# Patient Record
Sex: Male | Born: 1975 | Race: White | Hispanic: No | Marital: Married | Smoking: Never smoker
Health system: Southern US, Community
[De-identification: ages and names within clinical notes are randomized; demographics above are authoritative.]

---

## 2014-09-08 ENCOUNTER — Emergency Department (HOSPITAL_COMMUNITY)
Admission: EM | Admit: 2014-09-08 | Discharge: 2014-09-08 | Disposition: A | Discharge status: Home / Self Care | Payer: BC Managed Care – PPO | Attending: Emergency Medicine | Admitting: Emergency Medicine

## 2014-09-08 ENCOUNTER — Emergency Department (HOSPITAL_COMMUNITY): Payer: BC Managed Care – PPO

## 2014-09-08 ENCOUNTER — Encounter (HOSPITAL_COMMUNITY): Payer: Self-pay | Admitting: Family Medicine

## 2014-09-08 DIAGNOSIS — R0602 Shortness of breath: Secondary | ICD-10-CM | POA: Insufficient documentation

## 2014-09-08 DIAGNOSIS — J159 Unspecified bacterial pneumonia: Secondary | ICD-10-CM | POA: Diagnosis not present

## 2014-09-08 DIAGNOSIS — R002 Palpitations: Secondary | ICD-10-CM | POA: Diagnosis not present

## 2014-09-08 DIAGNOSIS — J189 Pneumonia, unspecified organism: Secondary | ICD-10-CM

## 2014-09-08 LAB — I-STAT CHEM 8, ED
BUN: 20 mg/dL (ref 6–23)
CALCIUM ION: 1.16 mmol/L (ref 1.12–1.23)
Chloride: 103 mEq/L (ref 96–112)
Creatinine, Ser: 1.2 mg/dL (ref 0.50–1.35)
Glucose, Bld: 87 mg/dL (ref 70–99)
HCT: 44 % (ref 39.0–52.0)
HEMOGLOBIN: 15 g/dL (ref 13.0–17.0)
Potassium: 3.9 mEq/L (ref 3.7–5.3)
SODIUM: 139 meq/L (ref 137–147)
TCO2: 24 mmol/L (ref 0–100)

## 2014-09-08 LAB — I-STAT TROPONIN, ED: Troponin i, poc: 0 ng/mL (ref 0.00–0.08)

## 2014-09-08 LAB — MAGNESIUM: MAGNESIUM: 2.2 mg/dL (ref 1.5–2.5)

## 2014-09-08 MED ORDER — DOXYCYCLINE HYCLATE 100 MG PO TABS
100.0000 mg | ORAL_TABLET | Freq: Once | ORAL | Status: AC
  Administered 2014-09-08: 100 mg via ORAL
  Filled 2014-09-08: qty 1

## 2014-09-08 MED ORDER — DOXYCYCLINE HYCLATE 100 MG PO CAPS: 100.0000 mg | ORAL_CAPSULE | Freq: Two times a day (BID) | ORAL | Status: AC

## 2014-09-08 NOTE — ED Notes (Signed)
Per pt sts driving in car today and started having some palpitations and SOB. sts similar episode yesterday and then it went away and he felt better. sts he went for a run yesterday.

## 2014-09-08 NOTE — ED Provider Notes (Signed)
CSN: [id number]     Arrival date & time 09/08/14  [id number] History   First MD Initiated Contact with Patient 09/08/14 [phone]     Chief Complaint  Patient presents with  . Palpitations     (Consider location/radiation/quality/duration/timing/severity/associated sxs/prior Treatment) HPI [name] is a 38 year old male with no past medical history who presents the ER complaining of intermittent palpitations for the past 1-2 weeks. Patient states his palpitations have occurred several times a day, and he has not noticed any aggravating or alleviating factors. Patient states the episodes last "several minutes", and subside spontaneously. Patient reports associated lightheadedness, mild shortness of breath with these episodes. Patient states these associated symptoms subside when the palpitations subside. Patient denies any associated chest pain, fever, syncope, nausea, vomiting, abdominal pain, dysuria.  History reviewed. No pertinent past medical history. History reviewed. No pertinent past surgical history. History reviewed. No pertinent family history. History  Substance Use Topics  . Smoking status: Never Smoker   . Smokeless tobacco: Not on file  . Alcohol Use: Yes    Review of Systems  Constitutional: Negative for fever.  HENT: Negative for trouble swallowing.   Eyes: Negative for visual disturbance.  Respiratory: Positive for shortness of breath.   Cardiovascular: Positive for palpitations. Negative for chest pain.  Gastrointestinal: Negative for nausea, vomiting and abdominal pain.  Genitourinary: Negative for dysuria.  Musculoskeletal: Negative for neck pain.  Skin: Negative for rash.  Neurological: Negative for dizziness, weakness and numbness.  Psychiatric/Behavioral: Negative.       Allergies  Review of patient's allergies indicates no known allergies.  Home Medications   Prior to Admission medications   Medication Sig Start Date End Date Taking? Authorizing Provider   doxycycline (VIBRAMYCIN) 100 MG capsule Take 1 capsule (100 mg total) by mouth 2 (two) times daily. One po bid x 7 days 09/08/14   Monte FantasiaJoseph W Brahim Dolman, PA-C   BP 126/75 mmHg  Pulse 70  Temp(Src) 98.9 F (37.2 C) (Oral)  Resp 12  Ht 6\' 1"  (1.854 m)  Wt 175 lb (79.379 kg)  BMI 23.09 kg/m2  SpO2 98% Physical Exam  Constitutional: He is oriented to person, place, and time. He appears well-developed and well-nourished. No distress.  HENT:  Head: Normocephalic and atraumatic.  Mouth/Throat: Oropharynx is clear and moist. No oropharyngeal exudate.  Eyes: Right eye exhibits no discharge. Left eye exhibits no discharge. No scleral icterus.  Neck: Normal range of motion.  Cardiovascular: Normal rate, regular rhythm, S1 normal, S2 normal and normal heart sounds.   No murmur heard. Pulmonary/Chest: Effort normal and breath sounds normal. No accessory muscle usage. No tachypnea. No respiratory distress.  Abdominal: Soft. Normal appearance and bowel sounds are normal. There is no tenderness.  Musculoskeletal: Normal range of motion. He exhibits no edema or tenderness.  Neurological: He is alert and oriented to person, place, and time. He has normal strength. No cranial nerve deficit. Coordination normal. GCS eye subscore is 4. GCS verbal subscore is 5. GCS motor subscore is 6.  Skin: Skin is warm and dry. No rash noted. He is not diaphoretic.  Psychiatric: He has a normal mood and affect.  Nursing note and vitals reviewed.   ED Course  Procedures (including critical care time) Labs Review Labs Reviewed  MAGNESIUM  I-STAT CHEM 8, ED  Rosezena SensorI-STAT TROPOININ, ED    Imaging Review Dg Chest 2 View  09/08/2014   CLINICAL DATA:  Cardiac palpitations with difficulty breathing  EXAM: CHEST  2 VIEW  COMPARISON:  None.  FINDINGS: There is a small area of infiltrate in the right upper lobe. Elsewhere lungs are clear. Heart size pulmonary vascularity are normal. No adenopathy. There is upper thoracic  levoscoliosis.  IMPRESSION: Small area of infiltrate in right upper lobe.   Electronically Signed   By: Bretta BangWilliam  Woodruff M.D.   On: 09/08/2014 10:23     EKG Interpretation   Date/Time:  Monday September 08 2014 08:59:17 EST Ventricular Rate:  81 PR Interval:  160 QRS Duration: 88 QT Interval:  380 QTC Calculation: 441 R Axis:   92 Text Interpretation:  Normal sinus rhythm Biatrial enlargement Rightward  axis Early repolarization No old tracing to compare Confirmed by [facility] CenterMCCMANUS   MD, Nicholos JohnsKATHLEEN [phone]([id number]) on 09/08/2014 9:27:31 AM      MDM   Final diagnoses:  Palpitations  Community acquired pneumonia    Intermittent palpitations with lightheadedness 1 week. Patient reporting symptoms decreased with exercise yesterday, and he "felt much better after going for a 5 mile run". -- Based on this information, pt's s/s are not exertional and non-concerning for anginal etiology. Patient is he has not taken his heart rate. PERC negative.  EKG sinus rhythm with no acute injury or ectopy.    Patient remaining asymptomatic while in the ER. Patient nontachypneic, non-hypoxic, non-tachycardic. Patient's radiograph remarkable for small area of infiltrate in upper right lobe consistent with possible pneumonia. Patient on reexam states he has been having a productive cough for several days. Patient states he is under a large amount of stress with his job, and states he is also felt anxiousness with his palpitations. With negative cardiac w/u-- negative troponin, HEART score 0, we will discharge pt at this time. Pt sent home on abx for CAP.  Pt instructed how to assess HR if he should experience palpitations and educated on what a normal level is vs an abnormal HR.  I strongly recommended pt f/u with his PCP regarding these s/s today.  I discussed return precautions with pt and encouraged him to call or return to the ER should he have any questions or concerns.    BP 126/75 mmHg  Pulse 70  Temp(Src) 98.9 F  (37.2 C) (Oral)  Resp 12  Ht 6\' 1"  (1.854 m)  Wt 175 lb (79.379 kg)  BMI 23.09 kg/m2  SpO2 98%  Signed,  Ladona MowJoe Marine Lezotte, PA-C 4:59 PM  Pt discussed with Dr. Samuel JesterKathleen McManus, MD.    Monte FantasiaJoseph W Rosanne Wohlfarth, PA-C 09/08/14 1659  Samuel JesterKathleen McManus, DO 09/11/14 1441

## 2014-09-08 NOTE — ED Notes (Signed)
Patient states he has been experiencing intermittent fluttering in the heart and SOB x 1 week.  Patient states these feelings have been experienced each day since the symptoms started.

## 2014-09-08 NOTE — Discharge Instructions (Signed)
Use extreme caution with exercise or strenuous activity before consulting your primary care doctor. Follow-up with your primary care doctor regarding the pneumonia and the palpitations. Return to the ER if you develop any severe shortness of breath, high fever greater than 100.5, chest pain or if you feel like you're going to pass out.  Pneumonia Pneumonia is an infection of the lungs.  CAUSES Pneumonia may be caused by bacteria or a virus. Usually, these infections are caused by breathing infectious particles into the lungs (respiratory tract). SIGNS AND SYMPTOMS   Cough.  Fever.  Chest pain.  Increased rate of breathing.  Wheezing.  Mucus production. DIAGNOSIS  If you have the common symptoms of pneumonia, your health care provider will typically confirm the diagnosis with a chest X-ray. The X-ray will show an abnormality in the lung (pulmonary infiltrate) if you have pneumonia. Other tests of your blood, urine, or sputum may be done to find the specific cause of your pneumonia. Your health care provider may also do tests (blood gases or pulse oximetry) to see how well your lungs are working. TREATMENT  Some forms of pneumonia may be spread to other people when you cough or sneeze. You may be asked to wear a mask before and during your exam. Pneumonia that is caused by bacteria is treated with antibiotic medicine. Pneumonia that is caused by the influenza virus may be treated with an antiviral medicine. Most other viral infections must run their course. These infections will not respond to antibiotics.  HOME CARE INSTRUCTIONS   Cough suppressants may be used if you are losing too much rest. However, coughing protects you by clearing your lungs. You should avoid using cough suppressants if you can.  Your health care provider may have prescribed medicine if he or she thinks your pneumonia is caused by bacteria or influenza. Finish your medicine even if you start to feel better.  Your  health care provider may also prescribe an expectorant. This loosens the mucus to be coughed up.  Take medicines only as directed by your health care provider.  Do not smoke. Smoking is a common cause of bronchitis and can contribute to pneumonia. If you are a smoker and continue to smoke, your cough may last several weeks after your pneumonia has cleared.  A cold steam vaporizer or humidifier in your room or home may help loosen mucus.  Coughing is often worse at night. Sleeping in a semi-upright position in a recliner or using a couple pillows under your head will help with this.  Get rest as you feel it is needed. Your body will usually let you know when you need to rest. PREVENTION A pneumococcal shot (vaccine) is available to prevent a common bacterial cause of pneumonia. This is usually suggested for:  People over 39 years old.  Patients on chemotherapy.  People with chronic lung problems, such as bronchitis or emphysema.  People with immune system problems. If you are over 65 or have a high risk condition, you may receive the pneumococcal vaccine if you have not received it before. In some countries, a routine influenza vaccine is also recommended. This vaccine can help prevent some cases of pneumonia.You may be offered the influenza vaccine as part of your care. If you smoke, it is time to quit. You may receive instructions on how to stop smoking. Your health care provider can provide medicines and counseling to help you quit. SEEK MEDICAL CARE IF: You have a fever. SEEK IMMEDIATE MEDICAL CARE IF:  Your illness becomes worse. This is especially true if you are elderly or weakened from any other disease.  You cannot control your cough with suppressants and are losing sleep.  You begin coughing up blood.  You develop pain which is getting worse or is uncontrolled with medicines.  Any of the symptoms which initially brought you in for treatment are getting worse rather than  better.  You develop shortness of breath or chest pain. MAKE SURE YOU:   Understand these instructions.  Will watch your condition.  Will get help right away if you are not doing well or get worse. Document Released: 10/03/2005 Document Revised: 02/17/2014 Document Reviewed: 12/23/2010 [facility] Patient Information 2015 [place]. This information is not intended to replace advice given to you by your health care provider. Make sure you discuss any questions you have with your health care provider.  Palpitations A palpitation is the feeling that your heartbeat is irregular or is faster than normal. It may feel like your heart is fluttering or skipping a beat. Palpitations are usually not a serious problem. However, in some cases, you may need further medical evaluation. CAUSES  Palpitations can be caused by:  Smoking.  Caffeine or other stimulants, such as diet pills or energy drinks.  Alcohol.  Stress and anxiety.  Strenuous physical activity.  Fatigue.  Certain medicines.  Heart disease, especially if you have a history of irregular heart rhythms (arrhythmias), such as atrial fibrillation, atrial flutter, or supraventricular tachycardia.  An improperly working pacemaker or defibrillator. DIAGNOSIS  To find the cause of your palpitations, your health care provider will take your medical history and perform a physical exam. Your health care provider may also have you take a test called an ambulatory electrocardiogram (ECG). An ECG records your heartbeat patterns over a 24-hour period. You may also have other tests, such as:  Transthoracic echocardiogram (TTE). During echocardiography, sound waves are used to evaluate how blood flows through your heart.  Transesophageal echocardiogram (TEE).  Cardiac monitoring. This allows your health care provider to monitor your heart rate and rhythm in real time.  Holter monitor. This is a portable device that records your  heartbeat and can help diagnose heart arrhythmias. It allows your health care provider to track your heart activity for several days, if needed.  Stress tests by exercise or by giving medicine that makes the heart beat faster. TREATMENT  Treatment of palpitations depends on the cause of your symptoms and can vary greatly. Most cases of palpitations do not require any treatment other than time, relaxation, and monitoring your symptoms. Other causes, such as atrial fibrillation, atrial flutter, or supraventricular tachycardia, usually require further treatment. HOME CARE INSTRUCTIONS   Avoid:  Caffeinated coffee, tea, soft drinks, diet pills, and energy drinks.  Chocolate.  Alcohol.  Stop smoking if you smoke.  Reduce your stress and anxiety. Things that can help you relax include:  A method of controlling things in your body, such as your heartbeats, with your mind (biofeedback).  Yoga.  Meditation.  Physical activity such as swimming, jogging, or walking.  Get plenty of rest and sleep. SEEK MEDICAL CARE IF:   You continue to have a fast or irregular heartbeat beyond 24 hours.  Your palpitations occur more often. SEEK IMMEDIATE MEDICAL CARE IF:  You have chest pain or shortness of breath.  You have a severe headache.  You feel dizzy or you faint. MAKE SURE YOU:  Understand these instructions.  Will watch your condition.  Will get  help right away if you are not doing well or get worse. Document Released: 09/30/2000 Document Revised: 10/08/2013 Document Reviewed: 12/02/2011 [facility] Patient Information 2015 [place], [place]. This information is not intended to replace advice given to you by your health care provider. Make sure you discuss any questions you have with your health care provider.

## 2016-04-01 IMAGING — DX DG CHEST 2V
2 series · 2 of 2 positions shown · non-contrast
Comparison: None.

CLINICAL DATA: Cardiac palpitations with difficulty breathing

EXAM:
CHEST  2 VIEW

[chest pa]
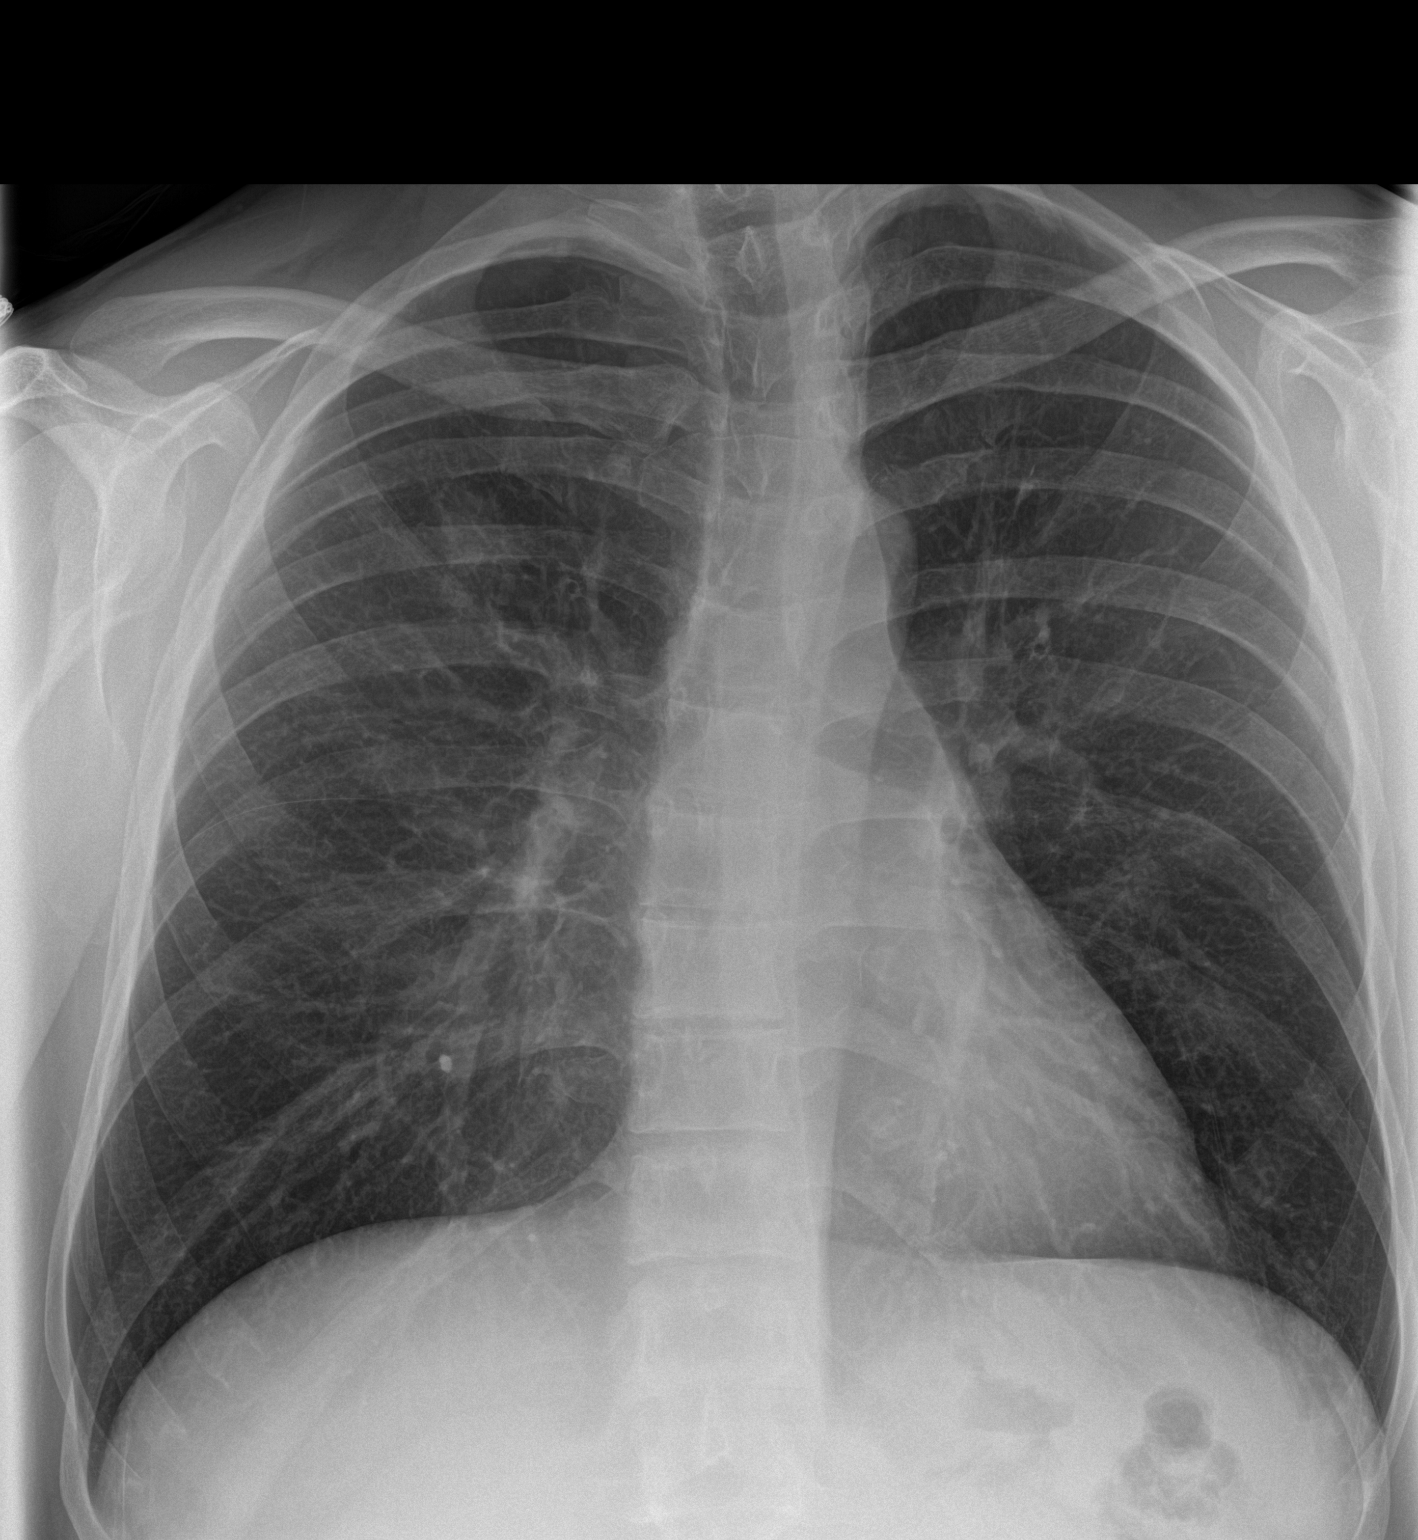

[chest lat]
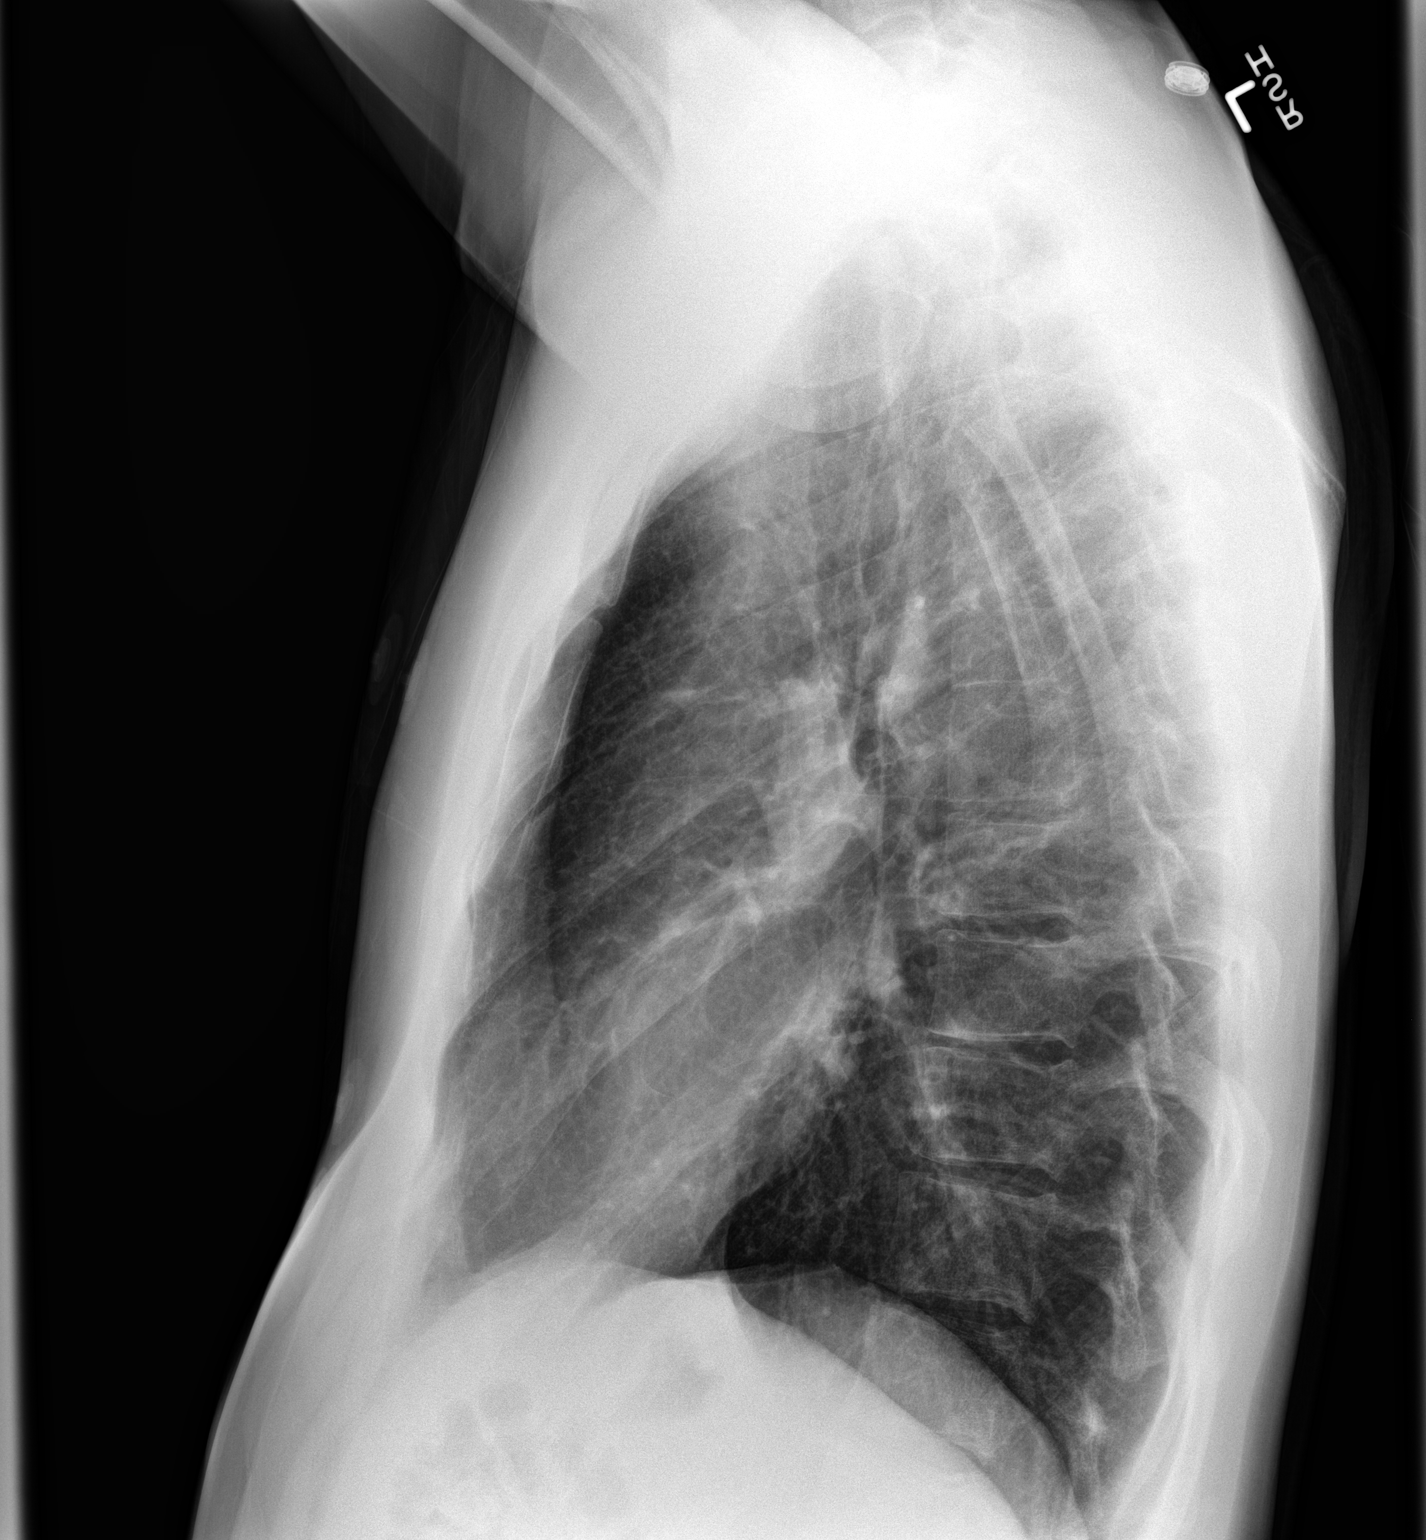

[2 of 2 positions shown; findings below may reference images not displayed]

FINDINGS: There is a small area of infiltrate in the right upper lobe.
Elsewhere lungs are clear. Heart size pulmonary vascularity are
normal. No adenopathy. There is upper thoracic levoscoliosis.
IMPRESSION: Small area of infiltrate in right upper lobe.
